# Patient Record
Sex: Male | Born: 1985 | Hispanic: Yes | Marital: Single | Smoking: Never smoker
Health system: Southern US, Community
[De-identification: ages and names within clinical notes are randomized; demographics above are authoritative.]

---

## 2019-09-22 ENCOUNTER — Emergency Department (HOSPITAL_COMMUNITY): Payer: Self-pay

## 2019-09-22 ENCOUNTER — Emergency Department (HOSPITAL_COMMUNITY)
Admission: EM | Admit: 2019-09-22 | Discharge: 2019-09-22 | Disposition: A | Discharge status: Home / Self Care | Payer: Self-pay | Attending: Emergency Medicine | Admitting: Emergency Medicine

## 2019-09-22 ENCOUNTER — Other Ambulatory Visit: Payer: Self-pay

## 2019-09-22 ENCOUNTER — Encounter (HOSPITAL_COMMUNITY): Payer: Self-pay | Admitting: Emergency Medicine

## 2019-09-22 DIAGNOSIS — W293XXA Contact with powered garden and outdoor hand tools and machinery, initial encounter: Secondary | ICD-10-CM | POA: Insufficient documentation

## 2019-09-22 DIAGNOSIS — Z23 Encounter for immunization: Secondary | ICD-10-CM | POA: Insufficient documentation

## 2019-09-22 DIAGNOSIS — Y929 Unspecified place or not applicable: Secondary | ICD-10-CM | POA: Insufficient documentation

## 2019-09-22 DIAGNOSIS — S51811A Laceration without foreign body of right forearm, initial encounter: Secondary | ICD-10-CM

## 2019-09-22 DIAGNOSIS — Y999 Unspecified external cause status: Secondary | ICD-10-CM | POA: Insufficient documentation

## 2019-09-22 DIAGNOSIS — S21311A Laceration without foreign body of right front wall of thorax with penetration into thoracic cavity, initial encounter: Secondary | ICD-10-CM | POA: Insufficient documentation

## 2019-09-22 DIAGNOSIS — S21111A Laceration without foreign body of right front wall of thorax without penetration into thoracic cavity, initial encounter: Secondary | ICD-10-CM

## 2019-09-22 DIAGNOSIS — Y939 Activity, unspecified: Secondary | ICD-10-CM | POA: Insufficient documentation

## 2019-09-22 MED ORDER — LIDOCAINE-EPINEPHRINE-TETRACAINE (LET) TOPICAL GEL
3.0000 mL | Freq: Once | TOPICAL | Status: AC
  Administered 2019-09-22: 3 mL via TOPICAL
  Filled 2019-09-22: qty 3

## 2019-09-22 MED ORDER — LIDOCAINE HCL (PF) 1 % IJ SOLN
30.0000 mL | Freq: Once | INTRAMUSCULAR | Status: DC
  Filled 2019-09-22: qty 30

## 2019-09-22 MED ORDER — TETANUS-DIPHTH-ACELL PERTUSSIS 5-2.5-18.5 LF-MCG/0.5 IM SUSP
0.5000 mL | Freq: Once | INTRAMUSCULAR | Status: AC
  Administered 2019-09-22: 0.5 mL via INTRAMUSCULAR
  Filled 2019-09-22: qty 0.5

## 2019-09-22 MED ORDER — NAPROXEN 250 MG PO TABS
500.0000 mg | ORAL_TABLET | Freq: Once | ORAL | Status: AC
  Administered 2019-09-22: 500 mg via ORAL
  Filled 2019-09-22: qty 2

## 2019-09-22 NOTE — Discharge Instructions (Addendum)
You were seen in the emergency department today after injuries related to a saw.  Your right forearm lacerations were closed with stitches, please keep these clean and dry for the next 24 hours, after 24 hours you may get them wet but do not soak them.  The right chest wall lacerations were closed with Steri-Strips, please leave it in place until they fall off.   We have updated your tetanus today.  Please follow attached wound care instructions.  Your stitches will need to be removed in 1 week, return to ER, go to urgent care, or see your primary care provider.  Please return to the ER sooner for new or worsening symptoms including but not limited to redness around the wound, drainage from the wound, fever, chills, increased pain, or any other concerns.

## 2019-09-22 NOTE — ED Notes (Signed)
Pt transported to xray 

## 2019-09-22 NOTE — ED Triage Notes (Addendum)
Pt reports laceration to R forearm from a saw while working on his house.  Pt had t-shirt tourniquet tied around R arm- removed on arrival.  Approx 5 and 6 cm lacerations to R forearm.  Bleeding controlled  Pt also has multiple superficial lacerations to R chest with bleeding controlled.  Bandages placed on arrival.

## 2019-09-22 NOTE — ED Provider Notes (Addendum)
[facility]   CSN: [id number] Arrival date & time: 09/22/19  1300     History Chief Complaint  Patient presents with  . Extremity Laceration    Adam Frazier is a 34 y.o. male without significant past medical history who presents to the emergency department with injuries to his right chest and right forearm after cutting himself with a chainsaw just prior to arrival.  Patient states that he was utilizing the saw when it kicked back and he accidentally cut himself in the right chest as well as the right forearm, the area was explored, especially the arm, no alleviating or aggravating factors.  No intervention prior to arrival.  Also nicked the 1st/2nd digits of the L hand. Unknown last tetanus.  He denies numbness, tingling, weakness, or any other areas of injury.  Patient primary language is Spanish, however he is able to communicate [place], offered translator which he declined.  HPI     History reviewed. No pertinent past medical history.  There are no problems to display for this patient.   History reviewed. No pertinent surgical history.     No family history on file.  Social History   Tobacco Use  . Smoking status: Never Smoker  . Smokeless tobacco: Never Used  Substance Use Topics  . Alcohol use: Not Currently  . Drug use: Never    Home Medications Prior to Admission medications   Not on File    Allergies    Patient has no allergy information on record.  Review of Systems   Review of Systems  Constitutional: Negative for chills and fever.  Respiratory: Negative for cough and shortness of breath.   Gastrointestinal: Negative for abdominal pain.  Musculoskeletal: Positive for myalgias.  Skin: Positive for wound.  Neurological: Negative for weakness and numbness.  All other systems reviewed and are negative.   Physical Exam Updated Vital Signs BP 133/85 (BP Location: Right Arm)   Pulse 71    Temp 98.7 F (37.1 C) (Oral)   Resp 20   SpO2 99%   Physical Exam Vitals and nursing note reviewed.  Constitutional:      General: He is not in acute distress.    Appearance: Normal appearance. He is not ill-appearing or toxic-appearing.  HENT:     Head: Normocephalic and atraumatic.  Neck:     Comments: No midline tenderness.  Cardiovascular:     Rate and Rhythm: Normal rate.     Pulses:          Radial pulses are 2+ on the right side and 2+ on the left side.  Pulmonary:     Effort: No respiratory distress.     Breath sounds: Normal breath sounds.  Chest:     Comments: Right chest wall just superior to the areola there are several abrasions/lacerations.  Lacerations are approximately 2 to 3 mm deep with obvious subcutaneous tissue exposed.  There is no visible foreign bodies. Abdominal:     General: There is no distension.     Palpations: Abdomen is soft.     Tenderness: There is no abdominal tenderness. There is no guarding or rebound.  Musculoskeletal:     Cervical back: Normal range of motion and neck supple.     Comments: Upper extremities: Patient has irregularly shaped lacerations to the ventral aspect of the right proximal forearm as pictured below.  Horizontal wound measures 7 cm in diameter area measures 7 cm, which would have subcutaneous fat  exposed, no visible tendon or fascia, approximately 3- 4 mm deep each. no obvious visible foreign bodies.  Patient has a small abrasion to the palmar aspect of the L thumb as well as a skin avulsion to the distal L 2nd digit with a piece of nail avulsed, does not extend back into the nailbed proximal to nail avulsion.  Patient has intact AROM throughout. . Tender to palpation over the wounds.  Has a few superficial abrasions more proximally to the upper arm right upper extremity.  Skin:    General: Skin is warm and dry.     Capillary Refill: Capillary refill takes less than 2 seconds.  Neurological:     Mental Status: He is alert.      Comments: Alert. Clear speech. Sensation grossly intact to bilateral upper extremities. 5/5 symmetric grip strength. Ambulatory.  Able to perform okay sign, thumbs up, cross second/third digits bilaterally.  Psychiatric:        Mood and Affect: Mood normal.        Behavior: Behavior normal.             ED Results / Procedures / Treatments   Labs (all labs ordered are listed, but only abnormal results are displayed) Labs Reviewed - No data to display  EKG None  Radiology No results found.  Procedures .Marland KitchenLaceration Repair  Date/Time: 09/22/2019 3:35 PM Performed by: Cherly Anderson, PA-C Authorized by: Cherly Anderson, PA-C   Consent:    Consent obtained:  Verbal   Consent given by:  Patient   Risks discussed:  Infection, need for additional repair, nerve damage, poor wound healing, poor cosmetic result, pain, retained foreign body, vascular damage and tendon damage   Alternatives discussed:  No treatment Anesthesia (see MAR for exact dosages):    Anesthesia method:  Topical application and local infiltration   Topical anesthetic:  LET   Local anesthetic:  Lidocaine 1% w/o epi Laceration details:    Location:  Shoulder/arm (More proximal laceration)   Shoulder/arm location:  R lower arm   Length (cm):  7   Depth (mm):  3 Repair type:    Repair type:  Simple Pre-procedure details:    Preparation:  Patient was prepped and draped in usual sterile fashion and imaging obtained to evaluate for foreign bodies Exploration:    Hemostasis achieved with:  Direct pressure   Contaminated: no   Treatment:    Area cleansed with:  Betadine   Amount of cleaning:  Standard   Irrigation solution:  Sterile water   Irrigation method:  Pressure wash Skin repair:    Repair method:  Sutures   Suture size:  4-0   Suture material:  Nylon   Suture technique:  Simple interrupted   Number of sutures:  7 Approximation:    Approximation:  Close Post-procedure  details:    Patient tolerance of procedure:  Tolerated well, no immediate complications .Marland KitchenLaceration Repair  Date/Time: 09/22/2019 3:38 PM Performed by: Cherly Anderson, PA-C Authorized by: Cherly Anderson, PA-C   Consent:    Consent obtained:  Verbal   Consent given by:  Patient   Risks discussed:  Infection, need for additional repair, nerve damage, poor wound healing, poor cosmetic result, pain, retained foreign body, tendon damage and vascular damage   Alternatives discussed:  No treatment Anesthesia (see MAR for exact dosages):    Anesthesia method:  Local infiltration and topical application   Topical anesthetic:  LET   Local anesthetic:  Lidocaine 1% w/o  epi Laceration details:    Location:  Shoulder/arm   Shoulder/arm location:  R lower arm   Length (cm):  7 Repair type:    Repair type:  Simple Pre-procedure details:    Preparation:  Patient was prepped and draped in usual sterile fashion and imaging obtained to evaluate for foreign bodies Exploration:    Hemostasis achieved with:  Direct pressure   Wound exploration: wound explored through full range of motion and entire depth of wound probed and visualized     Contaminated: no   Treatment:    Area cleansed with:  Betadine   Amount of cleaning:  Standard   Irrigation solution:  Sterile water   Irrigation method:  Pressure wash Skin repair:    Repair method:  Sutures   Suture size:  4-0   Suture material:  Nylon   Suture technique:  Simple interrupted   Number of sutures:  9 Approximation:    Approximation:  Close Post-procedure details:    Patient tolerance of procedure:  Tolerated well, no immediate complications .Marland KitchenLaceration Repair  Date/Time: 09/22/2019 3:39 PM Performed by: Cherly Anderson, PA-C Authorized by: Cherly Anderson, PA-C   Consent:    Consent obtained:  Verbal   Consent given by:  Patient   Risks discussed:  Infection, need for additional repair, poor cosmetic  result, pain, retained foreign body and poor wound healing   Alternatives discussed:  No treatment Anesthesia (see MAR for exact dosages):    Anesthesia method:  None Laceration details:    Location:  Trunk   Trunk location:  R chest   Wound length (cm): multiple small linear lacerations. Repair type:    Repair type:  Simple Pre-procedure details:    Preparation:  Imaging obtained to evaluate for foreign bodies Exploration:    Hemostasis achieved with:  Direct pressure   Wound exploration: wound explored through full range of motion and entire depth of wound probed and visualized   Treatment:    Area cleansed with:  Betadine   Irrigation solution:  Sterile water   Visualized foreign bodies/material removed: no   Skin repair:    Repair method:  Steri-Strips and tissue adhesive   Number of Steri-Strips:  4 (steri strips applied, dermabond utilized to secure) Approximation:    Approximation:  Close Post-procedure details:    Patient tolerance of procedure:  Tolerated well, no immediate complications   (including critical care time)  Medications Ordered in ED Medications  lidocaine (PF) (XYLOCAINE) 1 % injection 30 mL (has no administration in time range)  lidocaine-EPINEPHrine-tetracaine (LET) topical gel (3 mLs Topical Given 09/22/19 1349)  naproxen (NAPROSYN) tablet 500 mg (500 mg Oral Given 09/22/19 1349)  Tdap (BOOSTRIX) injection 0.5 mL (0.5 mLs Intramuscular Given 09/22/19 1350)    ED Course  I have reviewed the triage vital signs and the nursing notes.  Pertinent labs & imaging results that were available during my care of the patient were reviewed by me and considered in my medical decision making (see chart for details).    MDM Rules/Calculators/A&P                          Patient presents to the emergency department status post injuries related to a saw.  Patient is nontoxic, resting comfortably, vitals within normal limits.  Imaging obtained and areas of injury, no  underlying fracture/dislocation or radiopaque foreign bodies.  All soft tissue injuries were cleansed with Betadine and irrigated with sterile water,  visualized in a bloodless field without evidence of foreign body.  Right upper extremity x-rays were closed with sutures per procedure note above, chest wall wounds were closed with Steri-Strips and secured with Dermabond per procedure note above.  He has a left second digit skin avulsion did avulsed part of the nail which was clipped, does not seem to extend back into the nailbed, does not appear amenable to closure with sutures, does not appear to require nail removal at this time.  Bacitracin and dressings applied.  Patient neurovascularly intact distally to all wounds.  Will discharge home with wound care instructions and need for return for suture removal in the right upper extremity. I discussed results, treatment plan, need for follow-up, and return precautions with the patient. Provided opportunity for questions, patient confirmed understanding and is in agreement with plan. Findings & plan of care discussed w/ Dr. Lynelle Doctor who is in agreement.   Final Clinical Impression(s) / ED Diagnoses Final diagnoses:  Laceration of right forearm, initial encounter  Laceration of chest wall, right, initial encounter    Rx / DC Orders ED Discharge Orders    None       Cherly Anderson, PA-C 09/22/19 [address], PA-C 09/22/19 1638    Linwood Dibbles, MD 09/23/19 1525

## 2019-09-22 NOTE — ED Notes (Signed)
Pt transported back to xray

## 2019-09-30 ENCOUNTER — Other Ambulatory Visit: Payer: Self-pay

## 2019-09-30 ENCOUNTER — Ambulatory Visit (HOSPITAL_COMMUNITY)
Admission: EM | Admit: 2019-09-30 | Discharge: 2019-09-30 | Disposition: A | Discharge status: Home / Self Care | Payer: Self-pay

## 2019-09-30 NOTE — ED Triage Notes (Signed)
Pt presents to have sutures removed from right arm placed 9 days ago in the ed.

## 2021-06-05 ENCOUNTER — Emergency Department (HOSPITAL_COMMUNITY)
Admission: EM | Admit: 2021-06-05 | Discharge: 2021-06-05 | Disposition: A | Discharge status: Home / Self Care | Payer: Self-pay | Attending: Emergency Medicine | Admitting: Emergency Medicine

## 2021-06-05 ENCOUNTER — Other Ambulatory Visit: Payer: Self-pay

## 2021-06-05 ENCOUNTER — Encounter (HOSPITAL_COMMUNITY): Payer: Self-pay | Admitting: *Deleted

## 2021-06-05 DIAGNOSIS — R21 Rash and other nonspecific skin eruption: Secondary | ICD-10-CM | POA: Insufficient documentation

## 2021-06-05 DIAGNOSIS — M545 Low back pain, unspecified: Secondary | ICD-10-CM | POA: Insufficient documentation

## 2021-06-05 LAB — CBC
HCT: 40.3 % (ref 39.0–52.0)
Hemoglobin: 14.3 g/dL (ref 13.0–17.0)
MCH: 32.1 pg (ref 26.0–34.0)
MCHC: 35.5 g/dL (ref 30.0–36.0)
MCV: 90.4 fL (ref 80.0–100.0)
Platelets: 196 10*3/uL (ref 150–400)
RBC: 4.46 MIL/uL (ref 4.22–5.81)
RDW: 11.9 % (ref 11.5–15.5)
WBC: 5.4 10*3/uL (ref 4.0–10.5)
nRBC: 0 % (ref 0.0–0.2)

## 2021-06-05 LAB — COMPREHENSIVE METABOLIC PANEL
ALT: 20 U/L (ref 0–44)
AST: 25 U/L (ref 15–41)
Albumin: 3.9 g/dL (ref 3.5–5.0)
Alkaline Phosphatase: 81 U/L (ref 38–126)
Anion gap: 8 (ref 5–15)
BUN: 21 mg/dL — ABNORMAL HIGH (ref 6–20)
CO2: 22 mmol/L (ref 22–32)
Calcium: 8.9 mg/dL (ref 8.9–10.3)
Chloride: 107 mmol/L (ref 98–111)
Creatinine, Ser: 1.01 mg/dL (ref 0.61–1.24)
GFR, Estimated: >60 mL/min (ref >60)
Glucose, Bld: 135 mg/dL — ABNORMAL HIGH (ref 70–99)
Potassium: 3.7 mmol/L (ref 3.5–5.1)
Sodium: 137 mmol/L (ref 135–145)
Total Bilirubin: 0.6 mg/dL (ref 0.3–1.2)
Total Protein: 6.2 g/dL — ABNORMAL LOW (ref 6.5–8.1)

## 2021-06-05 LAB — LIPASE, BLOOD: Lipase: 36 U/L (ref 11–51)

## 2021-06-05 LAB — URINALYSIS, ROUTINE W REFLEX MICROSCOPIC
Bilirubin Urine: NEGATIVE
Glucose, UA: NEGATIVE mg/dL
Hgb urine dipstick: NEGATIVE
Ketones, ur: NEGATIVE mg/dL
Leukocytes,Ua: NEGATIVE
Nitrite: NEGATIVE
Protein, ur: NEGATIVE mg/dL
Specific Gravity, Urine: 1.019 (ref 1.005–1.030)
pH: 5 (ref 5.0–8.0)

## 2021-06-05 MED ORDER — FAMOTIDINE 20 MG PO TABS
40.0000 mg | ORAL_TABLET | Freq: Once | ORAL | Status: AC
  Administered 2021-06-05: 40 mg via ORAL
  Filled 2021-06-05: qty 2

## 2021-06-05 MED ORDER — FAMOTIDINE 20 MG PO TABS: 20.0000 mg | ORAL_TABLET | Freq: Two times a day (BID) | ORAL | 0 refills | Status: AC

## 2021-06-05 MED ORDER — PREDNISONE 10 MG PO TABS: 30.0000 mg | ORAL_TABLET | Freq: Every day | ORAL | 0 refills | Status: AC

## 2021-06-05 MED ORDER — DIPHENHYDRAMINE HCL 25 MG PO TABS: 25.0000 mg | ORAL_TABLET | Freq: Four times a day (QID) | ORAL | 0 refills | Status: AC | PRN

## 2021-06-05 MED ORDER — DICLOFENAC SODIUM 1 % EX GEL: 4.0000 g | Freq: Four times a day (QID) | CUTANEOUS | 0 refills | Status: AC

## 2021-06-05 MED ORDER — TRIAMCINOLONE ACETONIDE 0.1 % EX CREA: 1.0000 "application " | TOPICAL_CREAM | Freq: Two times a day (BID) | CUTANEOUS | 0 refills | Status: AC

## 2021-06-05 NOTE — Discharge Instructions (Addendum)
I suspect your rash is due to exposure to some irritating agent.  ?If you have recently changed detergents frequently or close please change back or use a hypoallergenic option. ? ?Keep the area moist with medications such as Aquaphor or Eucerin or Vaseline. ? ?Take Benadryl and Pepcid as prescribed.  I have also prescribed you some hydrocortisone cream ? ?In terms of the pain you are having in your left lower back I recommend warm compresses, Tylenol and ibuprofen as discussed below, massage, gentle stretching, Bengay/IcyHot or similar product.  Voltaren gel can also be helpful as an over-the-counter medicine. ? ?Please follow-up with the dermatologist for your rash and your primary care doctor for continued preventative care and to reevaluate your back pain and make sure it is improving. ? ?Please use Tylenol or ibuprofen for pain.  You may use 600 mg ibuprofen every 6 hours or 1000 mg of Tylenol every 6 hours.  You may choose to alternate between the 2.  This would be most effective.  Not to exceed 4 g of Tylenol within 24 hours.  Not to exceed 3200 mg ibuprofen 24 hours.  ? ?Return to the ER for any new or concerning symptoms ? ? ? ?Sospecho que su sarpullido se debe a la exposici?n a alg?n agente irritante. ?Si recientemente ha [employer] con frecuencia o ha cerrado, [place] o use una opci?n hipoalerg?nica. ? ?Mantenga el ?rea h?meda con medicamentos como Aquaphor o Eucerin o Vaseline. ? ?Tome Benadryl y Pepcid seg?n lo prescrito. Tambi?n te he recetado una crema de hidrocortisona. ? ?En cuanto al dolor que tiene en la parte baja de la espalda izquierda, le recomiendo compresas tibias, Tylenol e ibuprofeno como se explica a continuaci?n, masajes, estiramientos suaves, Bengay/IcyHot o un producto similar. Voltaren gel tambi?n puede ser ?til [place]. ? ?Haga un seguimiento con el dermat?logo para su erupci?n y con su m?dico de atenci?n primaria para recibir atenci?n  preventiva continua y para reevaluar su dolor de espalda y asegurarse de que est? mejorando. ? ?Utilice Tylenol o ibuprofeno para el dolor. Puede usar 600 mg de ibuprofeno cada 6 horas o 1000 mg de Tylenol cada 6 horas. Puede optar por [employer] 2. Esto ser?a m?s eficaz. No exceder los 4 g de Tylenol en 24 horas. No exceder los 3200 mg de ibuprofeno 24 horas. ? ?Regrese a la sala de emergencias por cualquier s?ntoma nuevo o preocupante ?

## 2021-06-05 NOTE — ED Triage Notes (Signed)
The pt has an itching raised red rash scattered over his arms and his torso  language barrier ?

## 2021-06-05 NOTE — ED Provider Notes (Signed)
?[facility] ?Provider Note ? ? ?CSN: [id number] ?Arrival date & time: 06/05/21  0057 ? ?  ? ?History ? ?Chief Complaint  ?Patient presents with  ? Back Pain  ? ? ?Adam Frazier is a 36 y.o. male. ? ? ?Back Pain ?Spanish-language interpreter was used for the entirety of visit.  Patient is a 36 year old Spanish-speaking male presented to the emergency room today with complaints of rash and left buttocks pain.  He states that he is generally healthy.  He does have a primary care provider that he states that he is scheduled to see next month. ? ?He states that over the past 4 days he has had a rash that was itchy seems to be located primarily on his right forearm and inside his pants waistline on the left and right side of his hip but not circulating all the way around.  He states that his itchy uncomfortable but not necessarily painful.  He denies any known exposure to any new irritants such as new animals and denies any new medications.  He does not think that he has any new detergents in the house but will have to verify this with his wife.  He denies any nausea vomiting shortness of breath lip or tongue or throat swelling.   ? ?He states that he is also here because he is having some back pain.  Seems that it is left buttocks in origin.  He denies any new straining activities or heavy exercise.  He works a sedentary job.  He does not have any pain radiating down his leg but rather seems to be in his left buttocks although he tells me that at one point over the past 2 weeks it has been worse and extended across his low back ? ?  ? ?Home Medications ?Prior to Admission medications   ?Medication Sig Start Date End Date Taking? Authorizing Provider  ?acetaminophen (TYLENOL) 500 MG tablet Take 500 mg by mouth every 6 (six) hours as needed for moderate pain or headache.   Yes [provider]  ?diclofenac Sodium (VOLTAREN) 1 % GEL Apply 4 g topically 4 (four) times  daily. 06/05/21  Yes Gailen ShelterFondaw, Romel Dumond S, PA  ?diphenhydrAMINE (BENADRYL) 25 MG tablet Take 1 tablet (25 mg total) by mouth every 6 (six) hours as needed. 06/05/21  Yes Solon AugustaFondaw, Josephmichael Lisenbee S, PA  ?famotidine (PEPCID) 20 MG tablet Take 1 tablet (20 mg total) by mouth 2 (two) times daily. 06/05/21  Yes [place] Sigmund, Rodrigo RanWylder S, PA  ?loratadine (CLARITIN) 10 MG tablet Take 10 mg by mouth daily as needed for allergies.   Yes [provider]  ?Multiple Minerals-Vitamins (CAL-MAG-ZINC-D PO) Take 3 tablets by mouth daily.   Yes [provider]  ?OVER THE COUNTER MEDICATION Take 1 tablet by mouth every 8 (eight) hours as needed (leg and back pain). Leg and Back Pain Reliever   Yes [provider]  ?triamcinolone cream (KENALOG) 0.1 % Apply 1 application. topically 2 (two) times daily. Apply to the areas of rash 06/05/21  Yes Gailen ShelterFondaw, Williamson Cavanah S, PA  ?   ? ?Allergies    ?Patient has no known allergies.   ? ?Review of Systems   ?Review of Systems  ?Musculoskeletal:  Positive for back pain.  ? ?Physical Exam ?Updated Vital Signs ?BP (!) 119/91   Pulse 68   Temp 97.7 ?F (36.5 ?C)   Resp 19   Ht 5\' 5"  (1.651 m)   Wt 77.1 kg   SpO2  100%   BMI 28.29 kg/m?  ?Physical Exam ?Vitals and nursing note reviewed.  ?Constitutional:   ?   General: He is not in acute distress. ?HENT:  ?   Head: Normocephalic and atraumatic.  ?   Nose: Nose normal.  ?   Mouth/Throat:  ?   Mouth: Mucous membranes are moist.  ?Eyes:  ?   General: No scleral icterus. ?Cardiovascular:  ?   Rate and Rhythm: Normal rate and regular rhythm.  ?   Pulses: Normal pulses.  ?   Heart sounds: Normal heart sounds.  ?Pulmonary:  ?   Effort: Pulmonary effort is normal.  ?Abdominal:  ?   Palpations: Abdomen is soft.  ?   Tenderness: There is no abdominal tenderness. There is no guarding or rebound.  ?Musculoskeletal:  ?   Cervical back: Normal range of motion.  ?   Right lower leg: No edema.  ?   Left lower leg: No edema.  ?Skin: ?   General: Skin is warm and dry.   ?   Capillary Refill: Capillary refill takes less than 2 seconds.  ?   Comments: Rash to left forearm and BL anterior hips see pictures below.  ? ?Blanching  ?Neurological:  ?   Mental Status: He is alert. Mental status is at baseline.  ?Psychiatric:     ?   Mood and Affect: Mood normal.     ?   Behavior: Behavior normal.  ? ? ? ? ? ? ? ?ED Results / Procedures / Treatments   ?Labs ?(all labs ordered are listed, but only abnormal results are displayed) ?Labs Reviewed  ?COMPREHENSIVE METABOLIC PANEL - Abnormal; Notable for the following components:  ?    Result Value  ? Glucose, Bld 135 (*)   ? BUN 21 (*)   ? Total Protein 6.2 (*)   ? All other components within normal limits  ?LIPASE, BLOOD  ?CBC  ?URINALYSIS, ROUTINE W REFLEX MICROSCOPIC  ? ? ?EKG ?None ? ?Radiology ?No results found. ? ?Procedures ?Procedures  ? ? ?Medications Ordered in ED ?Medications  ?famotidine (PEPCID) tablet 40 mg (has no administration in time range)  ? ? ?ED Course/ Medical Decision Making/ A&P ?  ?                        ?Medical Decision Making ? ?This patient presents to the ED for concern of rash, back pain, this involves a number of treatment options, and is a complaint that carries with it a high risk of complications and morbidity.  The differential diagnosis includes The emergent differential diagnosis for back pain includes but is not limited to fracture, muscle strain, cauda equina, spinal stenosis. DDD, ankylosing spondylitis, acute ligamentous injury, disk herniation, spondylolisthesis, Epidural compression syndrome, metastatic cancer, transverse myelitis, vertebral osteomyelitis, diskitis, kidney stone, pyelonephritis, AAA, Perforated ulcer, Retrocecal appendicitis, pancreatitis, bowel obstruction, retroperitoneal hemorrhage or mass, meningitis.  ? ?Patient has no midline back pain.  Low suspicion for spinal epidural abscess or other disease of the spinal column suspect this is more muscular given the location and his exam  and history. ? ? ?Co morbidities: ?Discussed in HPI ? ? ?Brief History: ? ?Spanish-language interpreter was used for the entirety of visit.  Patient is a 36 year old Spanish-speaking male presented to the emergency room today with complaints of rash and left buttocks pain.  He states that he is generally healthy.  He does have a primary care provider that he states that he  is scheduled to see next month. ? ?He states that over the past 4 days he has had a rash that was itchy seems to be located primarily on his right forearm and inside his pants waistline on the left and right side of his hip but not circulating all the way around.  He states that his itchy uncomfortable but not necessarily painful.  He denies any known exposure to any new irritants such as new animals and denies any new medications.  He does not think that he has any new detergents in the house but will have to verify this with his wife.  He denies any nausea vomiting shortness of breath lip or tongue or throat swelling.   ? ?He states that he is also here because he is having some back pain.  Seems that it is left buttocks in origin.  He denies any new straining activities or heavy exercise.  He works a sedentary job.  He does not have any pain radiating down his leg but rather seems to be in his left buttocks although he tells me that at one point over the past 2 weeks it has been worse and extended across his low back ? ? ?PE: rash present, TTP over gluteal musculature  ? ? ? ?EMR reviewed including pt PMHx, past surgical history and past visits to ER.  ? ?See HPI for more details ? ? ?Lab Tests: ? ?Labs and urinalysis were ordered in triage ?I personally interpreted his labs.  CMP relatively unremarkable, no anion gap normal bicarb and electrolytes are normal.  CBC lipase and urinalysis unremarkable. ? ? ?Imaging Studies: ? ?No imaging studies ordered for this patient ? ? ? ?Cardiac Monitoring: ? ? ? ? ? ?Medicines ordered: ? ?I ordered  medication including famotidine for itching rash ?Reevaluation of the patient after these medicines showed that the patient stayed the same patient was found in the department on after to reevaluate after medication. ?

## 2021-06-05 NOTE — ED Triage Notes (Signed)
The pt is c/o lt flank pain and some redness of this rt forearm  in a straight line  no known injury  the pt reports back pain 2 weeks ago that went away then returned today ?

## 2021-06-13 ENCOUNTER — Other Ambulatory Visit: Payer: Self-pay

## 2021-06-13 ENCOUNTER — Emergency Department (HOSPITAL_COMMUNITY)
Admission: EM | Admit: 2021-06-13 | Discharge: 2021-06-13 | Disposition: A | Discharge status: Home / Self Care | Payer: Self-pay | Attending: Student | Admitting: Student

## 2021-06-13 DIAGNOSIS — R21 Rash and other nonspecific skin eruption: Secondary | ICD-10-CM

## 2021-06-13 DIAGNOSIS — L299 Pruritus, unspecified: Secondary | ICD-10-CM | POA: Insufficient documentation

## 2021-06-13 MED ORDER — METHYLPREDNISOLONE 4 MG PO TBPK: ORAL_TABLET | ORAL | 0 refills | Status: AC

## 2021-06-13 MED ORDER — HYDROXYZINE HCL 25 MG PO TABS
25.0000 mg | ORAL_TABLET | Freq: Once | ORAL | Status: AC
  Administered 2021-06-13: 25 mg via ORAL
  Filled 2021-06-13: qty 1

## 2021-06-13 MED ORDER — PERMETHRIN 5 % EX CREA: TOPICAL_CREAM | CUTANEOUS | 0 refills | Status: AC

## 2021-06-13 MED ORDER — PREDNISONE 20 MG PO TABS
60.0000 mg | ORAL_TABLET | Freq: Once | ORAL | Status: AC
  Administered 2021-06-13: 60 mg via ORAL
  Filled 2021-06-13: qty 3

## 2021-06-13 MED ORDER — HYDROXYZINE HCL 25 MG PO TABS: 25.0000 mg | ORAL_TABLET | Freq: Four times a day (QID) | ORAL | 0 refills | Status: AC

## 2021-06-13 NOTE — ED Provider Triage Note (Signed)
Emergency Medicine Provider Triage Evaluation Note  Adam Frazier , a 36 y.o. male  was evaluated in triage.  Pt complains of rash for the past 14 days. He was seen and evaluated on 06/05/2021 and was given topical steroids and antihistamines.  He has been using these medications but feels like his symptoms have worsened.  Feels that his rash has spread to his torso and is more itchy on his arms.  The only thing that he can think of would cause of symptoms is that he reached under his trailer to find some nails when symptoms first began.  Denies any lip or tongue swelling.  Review of Systems  Positive: Rash Negative: Angioedema  Physical Exam  There were no vitals taken for this visit. Gen:   Awake, no distress   Resp:  Normal effort  MSK:   Moves extremities without difficulty  Other:  Rash noted to torso and right forearm, appears progressed from prior pictures on 06/05/2021  Medical Decision Making  Medically screening exam initiated at 5:23 PM.  Appropriate orders placed.  Adam Frazier was informed that the remainder of the evaluation will be completed by another provider, this initial triage assessment does not replace that evaluation, and the importance of remaining in the ED until their evaluation is complete.  We will need further treatment   Dietrich Pates, PA-C 06/13/21 1724

## 2021-06-13 NOTE — ED Triage Notes (Signed)
Patient coming from home, complaint of rash on arms, was seen on 5/14 for same, given medication but has not had relief.

## 2021-06-13 NOTE — ED Provider Notes (Signed)
[facility] EMERGENCY DEPARTMENT Provider Note  CSN: [id number] Arrival date & time: 06/13/21 1628  Chief Complaint(s) Rash  HPI Adam Frazier is a 36 y.o. male who presents emergency department for evaluation of a pruritic rash.  Patient was seen on 06/05/2021 for similar complaint.  He states that it all began when he stuck his right hand into a box in the garage and he then had pruritic papular rash that spread up his right arm and onto the chest.  There are no lesions on the back, perineum.  There are also lesions on the calves.  He denies associated chest pain, shortness of breath, Donnell pain, nausea, vomiting or other systemic symptoms.  He states that he has been using his triamcinolone cream, taking Benadryl and Pepcid without relief.    Past Medical History No past medical history on file. There are no problems to display for this patient.  Home Medication(s) Prior to Admission medications   Medication Sig Start Date End Date Taking? Authorizing Provider  acetaminophen (TYLENOL) 500 MG tablet Take 500 mg by mouth every 6 (six) hours as needed for moderate pain or headache.    [provider]  diclofenac Sodium (VOLTAREN) 1 % GEL Apply 4 g topically 4 (four) times daily. 06/05/21   Gailen Shelter, PA  diphenhydrAMINE (BENADRYL) 25 MG tablet Take 1 tablet (25 mg total) by mouth every 6 (six) hours as needed. 06/05/21   Gailen Shelter, PA  famotidine (PEPCID) 20 MG tablet Take 1 tablet (20 mg total) by mouth 2 (two) times daily. 06/05/21   Gailen Shelter, PA  loratadine (CLARITIN) 10 MG tablet Take 10 mg by mouth daily as needed for allergies.    [provider]  Multiple Minerals-Vitamins (CAL-MAG-ZINC-D PO) Take 3 tablets by mouth daily.    [provider]  OVER THE COUNTER MEDICATION Take 1 tablet by mouth every 8 (eight) hours as needed (leg and back pain). Leg and Back Pain Reliever    [provider]   triamcinolone cream (KENALOG) 0.1 % Apply 1 application. topically 2 (two) times daily. Apply to the areas of rash 06/05/21   Gailen Shelter, PA                                                                                                                                    Past Surgical History No past surgical history on file. Family History No family history on file.  Social History Social History   Tobacco Use   Smoking status: Never   Smokeless tobacco: Never  Substance Use Topics   Alcohol use: Not Currently   Drug use: Never   Allergies Patient has no known allergies.  Review of Systems Review of Systems  Skin:  Positive for rash.   Physical Exam Vital Signs  I have reviewed the triage vital signs BP (!) 138/93   Pulse 69  Temp (!) 97.5 F (36.4 C) (Oral)   Resp 16   SpO2 98%   Physical Exam Vitals and nursing note reviewed.  Constitutional:      General: He is not in acute distress.    Appearance: He is well-developed.  HENT:     Head: Normocephalic and atraumatic.  Eyes:     Conjunctiva/sclera: Conjunctivae normal.  Cardiovascular:     Rate and Rhythm: Normal rate and regular rhythm.     Heart sounds: No murmur heard. Pulmonary:     Effort: Pulmonary effort is normal. No respiratory distress.     Breath sounds: Normal breath sounds.  Abdominal:     Palpations: Abdomen is soft.     Tenderness: There is no abdominal tenderness.  Musculoskeletal:        General: No swelling.     Cervical back: Neck supple.  Skin:    General: Skin is warm and dry.     Capillary Refill: Capillary refill takes less than 2 seconds.     Findings: Rash present.  Neurological:     Mental Status: He is alert.  Psychiatric:        Mood and Affect: Mood normal.    ED Results and Treatments Labs (all labs ordered are listed, but only abnormal results are displayed) Labs Reviewed - No data to display                                                                                                                         Radiology No results found.  Pertinent labs & imaging results that were available during my care of the patient were reviewed by me and considered in my medical decision making (see MDM for details).  Medications Ordered in ED Medications - No data to display                                                                                                                                   Procedures Procedures  (including critical care time)  Medical Decision Making / ED Course   This patient presents to the ED for concern of rash, this involves an extensive number of treatment options, and is a complaint that carries with it a high risk of complications and morbidity.  The differential diagnosis includes contact dermatitis, eczema, urticaria, scabies, bedbugs  MDM: Patient seen emergency room for evaluation of pruritic rash.  Physical exam  reveals an extensive papular pruritic rash of the right arm, across the trunk with a few lesions on the inner calves.  No mucosal involvement.  As the patient's main complaint is itching, we trialed steroids and Atarax today and the patient has had some mild improvement.  We will discharge the patient on a Medrol Dosepak and Atarax and the patient will need to follow-up outpatient with dermatology for further evaluation.  Given that the patient's pruritus is quite significant, cannot rule out scabies and given favorable side effect profile to single dose permethrin cream, we will also try this.  Patient then discharged with dermatology follow-up.  Low suspicion for SJS or dress given current clinical presentation and no additional associated symptoms.   Additional history obtained:  -External records from outside source obtained and reviewed including: Chart review including previous notes, labs, imaging, consultation notes    Medicines ordered and prescription drug management: No orders of the  defined types were placed in this encounter.   -I have reviewed the patients home medicines and have made adjustments as needed  Critical interventions none    Cardiac Monitoring: The patient was maintained on a cardiac monitor.  I personally viewed and interpreted the cardiac monitored which showed an underlying rhythm of: NSR  Social Determinants of Health:  Factors impacting patients care include: spanish speaking   Reevaluation: After the interventions noted above, I reevaluated the patient and found that they have :improved  Co morbidities that complicate the patient evaluation No past medical history on file.    Dispostion: I considered admission for this patient, but he does not meet inpatient criteria for admission and is safe for discharge with outpatient dermatology follow-up.     Final Clinical Impression(s) / ED Diagnoses Final diagnoses:  None     @PCDICTATION @    , MD 06/13/21 [phone]

## 2021-09-12 IMAGING — DX DG HAND COMPLETE 3+V*L*
3 series · 3 of 3 positions shown · non-contrast
Comparison: None.

CLINICAL DATA: Injury. Index finger laceration secondary to saw
accident.

EXAM:
LEFT HAND - COMPLETE 3+ VIEW

[x hand pa left]
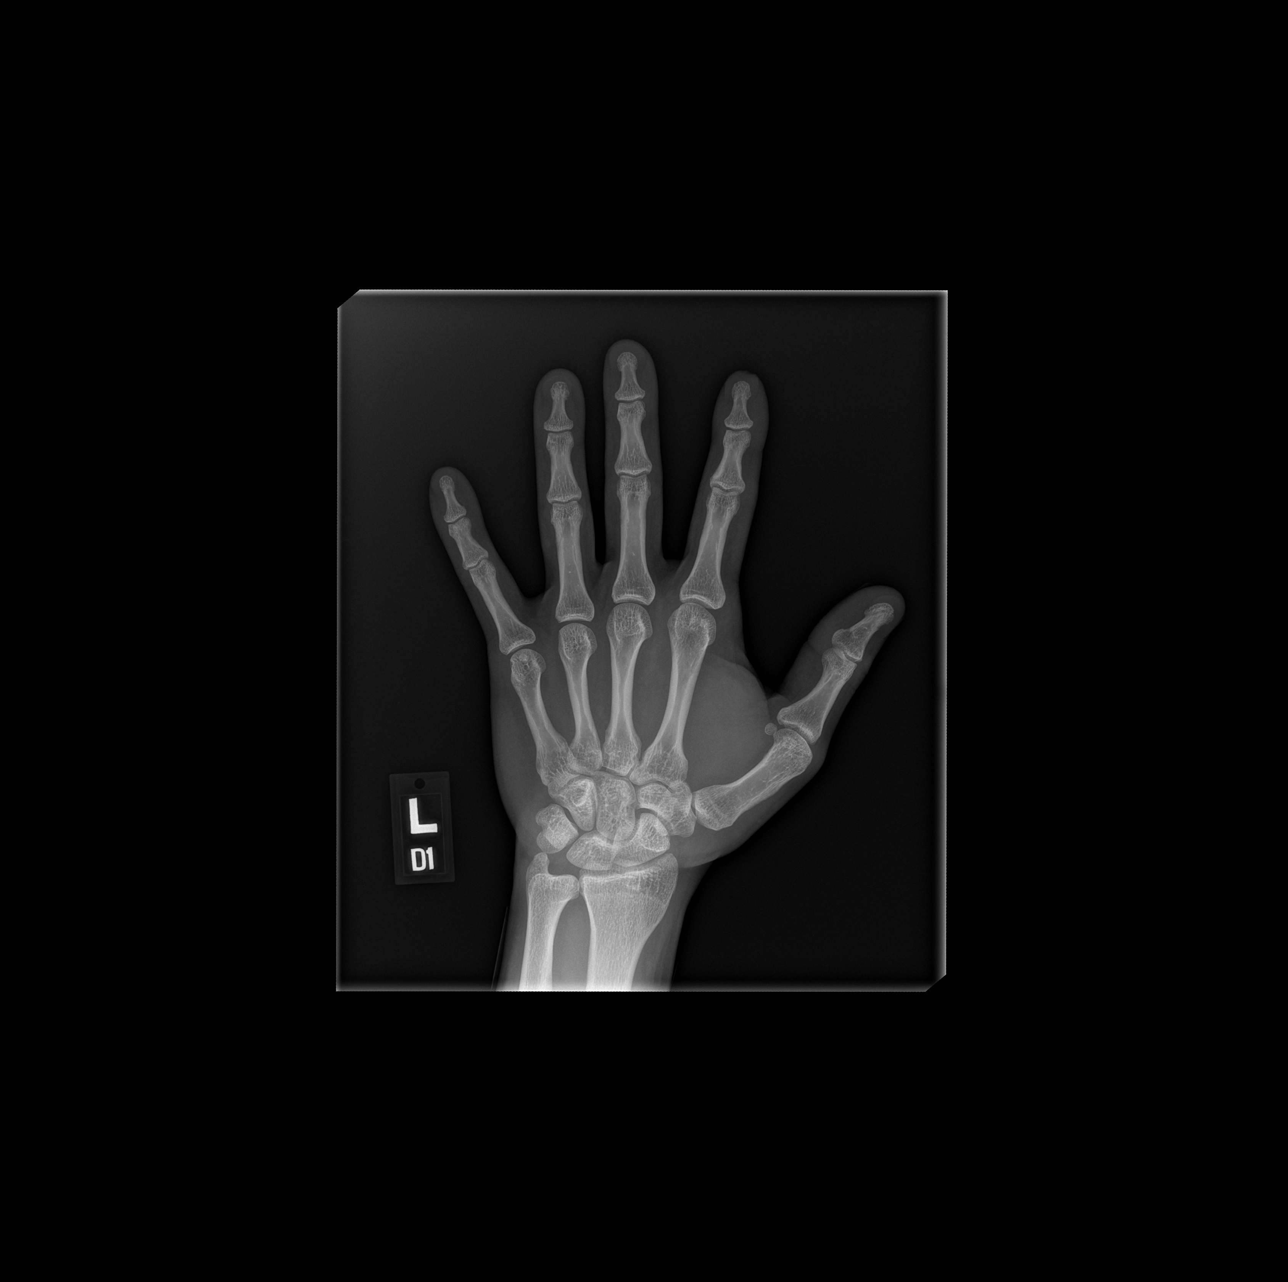

[x hand obl left]
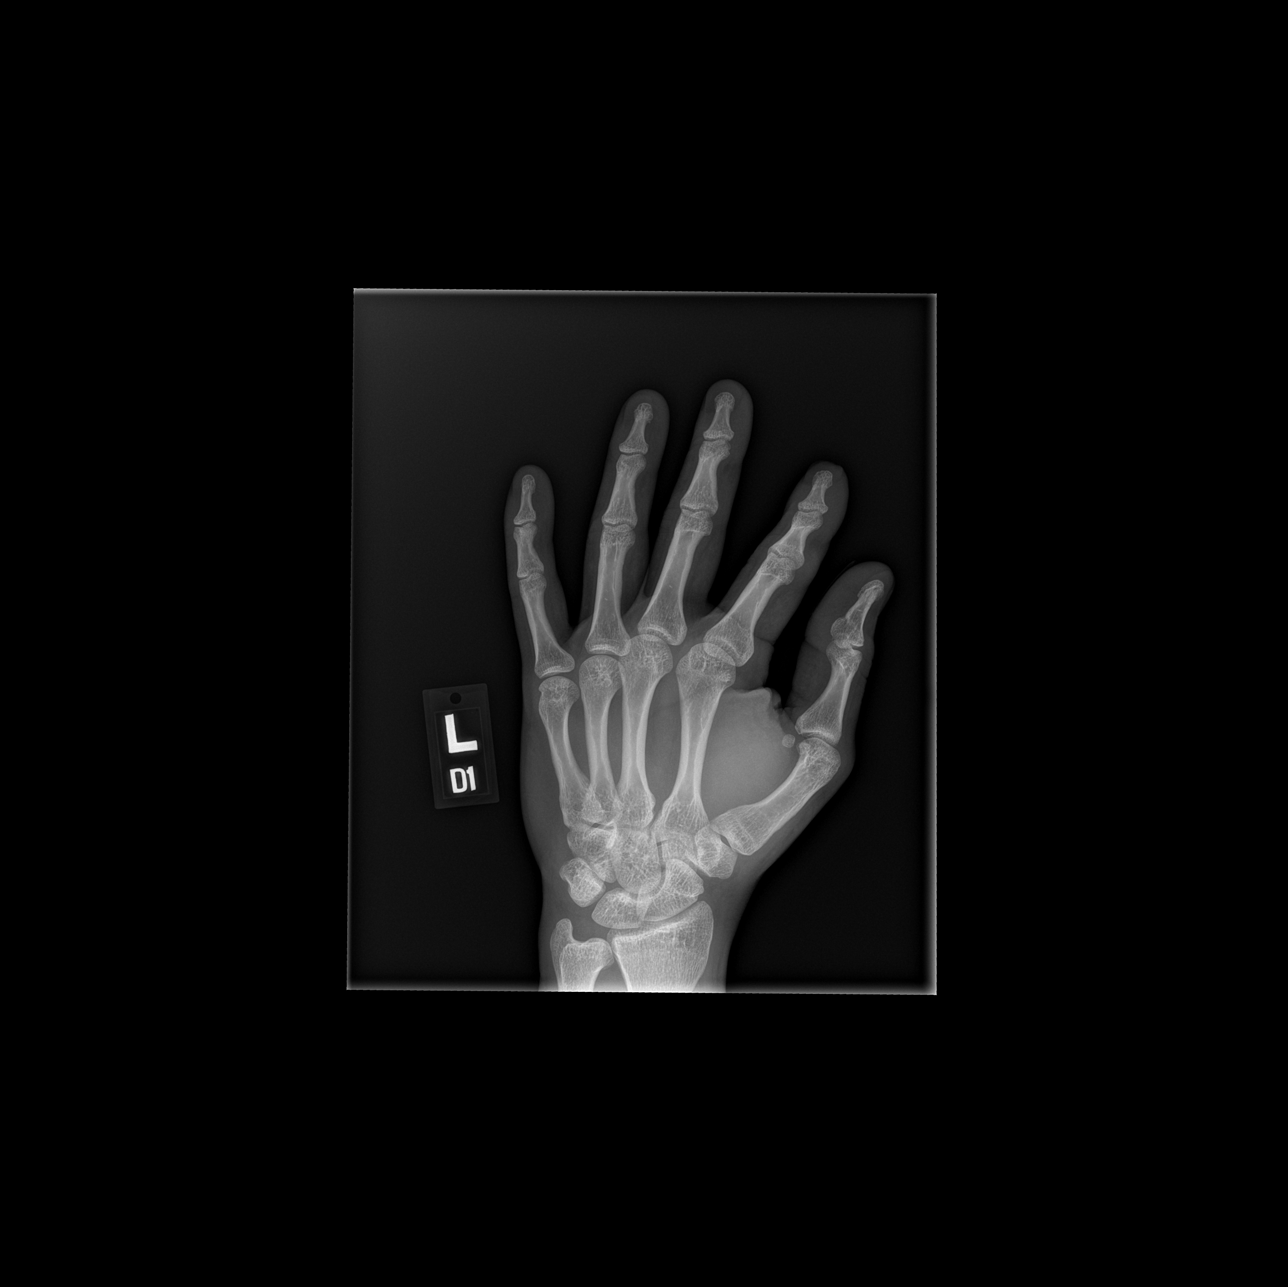

[x hand lat left]
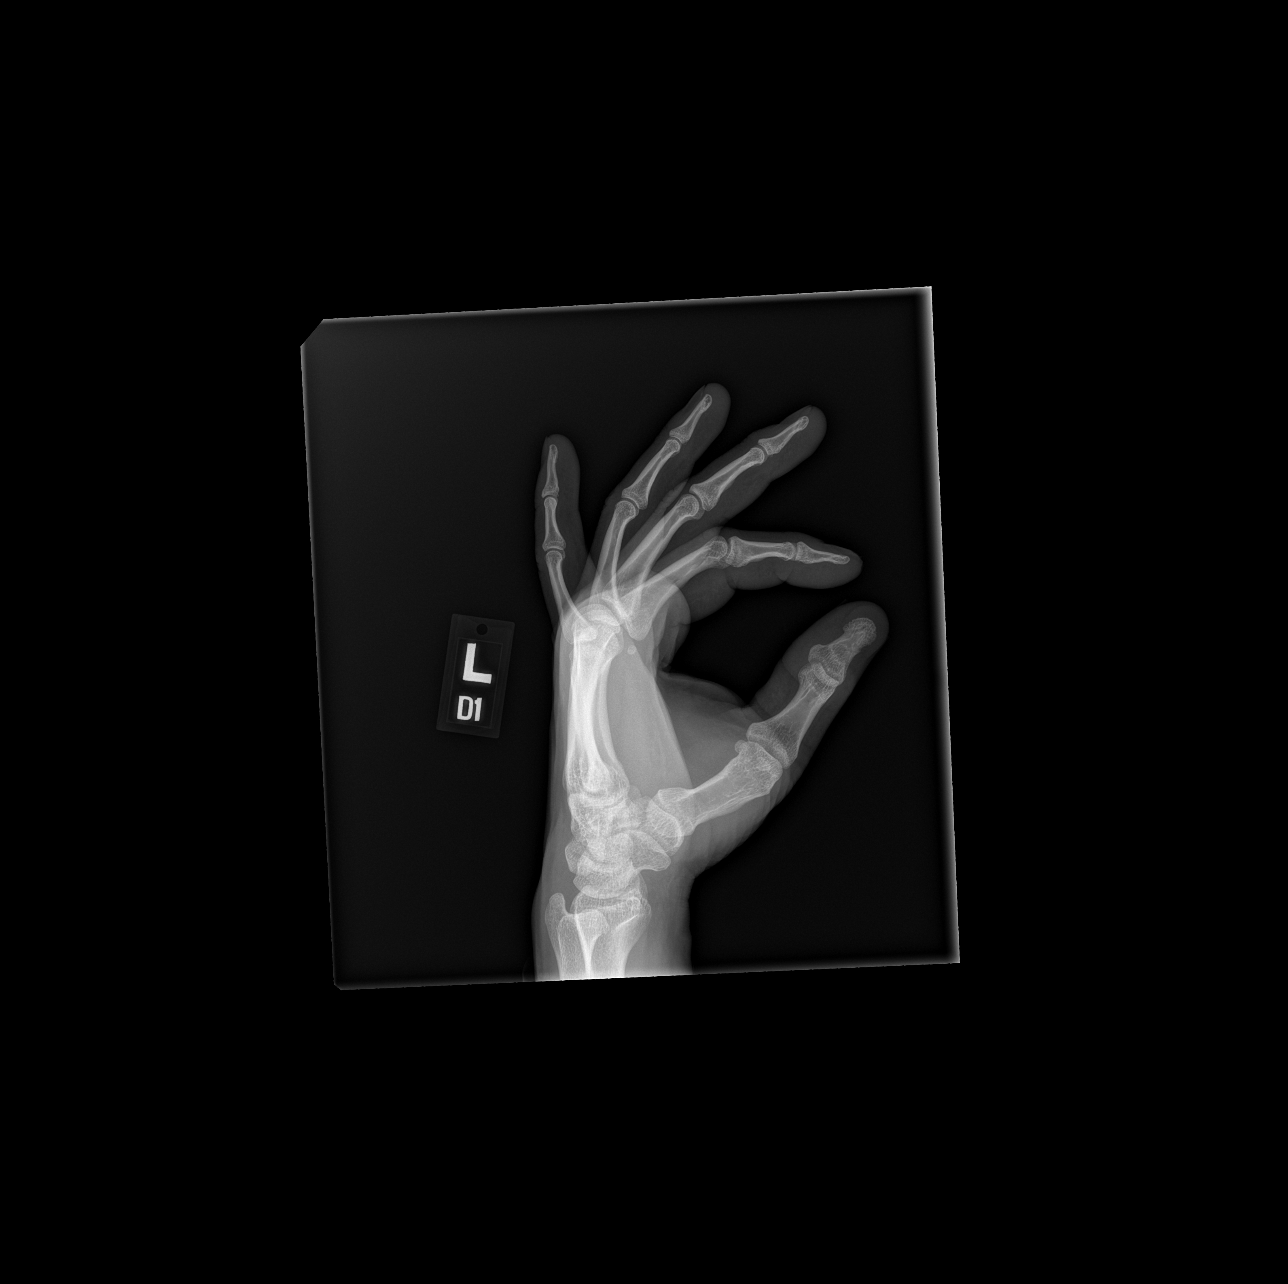

[3 of 3 positions shown; findings below may reference images not displayed]

FINDINGS: There is no evidence of fracture or dislocation. There is no
evidence of arthropathy or other focal bone abnormality. No
radiopaque foreign bodies.
IMPRESSION: No acute fracture or malalignment.

## 2021-12-19 ENCOUNTER — Other Ambulatory Visit: Payer: Self-pay

## 2021-12-19 ENCOUNTER — Emergency Department (HOSPITAL_COMMUNITY)
Admission: EM | Admit: 2021-12-19 | Discharge: 2021-12-20 | Disposition: A | Discharge status: Home / Self Care | Payer: Self-pay | Attending: Emergency Medicine | Admitting: Emergency Medicine

## 2021-12-19 ENCOUNTER — Emergency Department (HOSPITAL_COMMUNITY): Payer: Self-pay

## 2021-12-19 DIAGNOSIS — M5442 Lumbago with sciatica, left side: Secondary | ICD-10-CM | POA: Insufficient documentation

## 2021-12-19 DIAGNOSIS — M5441 Lumbago with sciatica, right side: Secondary | ICD-10-CM | POA: Insufficient documentation

## 2021-12-19 DIAGNOSIS — M546 Pain in thoracic spine: Secondary | ICD-10-CM | POA: Insufficient documentation

## 2021-12-19 LAB — URINALYSIS, ROUTINE W REFLEX MICROSCOPIC
Bilirubin Urine: NEGATIVE
Glucose, UA: NEGATIVE mg/dL
Hgb urine dipstick: NEGATIVE
Ketones, ur: NEGATIVE mg/dL
Leukocytes,Ua: NEGATIVE
Nitrite: NEGATIVE
Protein, ur: NEGATIVE mg/dL
Specific Gravity, Urine: <1.005 — ABNORMAL LOW (ref 1.005–1.030)
pH: 6 (ref 5.0–8.0)

## 2021-12-19 NOTE — ED Provider Triage Note (Signed)
Emergency Medicine Provider Triage Evaluation Note  Adam Frazier , a 36 y.o. male  was evaluated in triage.  Pt complains of severe back pain no inciting injury pain is over the sacrum worse with movement better with rest does not radiate no red flag symptoms..  Review of Systems  Positive: Back pain Negative: Fever  Physical Exam  BP 124/86 (BP Location: Right Arm)   Pulse 60   Temp 98.1 F (36.7 C) (Oral)   Resp 18   SpO2 99%  Gen:   Awake, no distress   Resp:  Normal effort  MSK:   Moves extremities without difficulty  Other:    Medical Decision Making  Medically screening exam initiated at 5:42 PM.  Appropriate orders placed.  Adam Frazier was informed that the remainder of the evaluation will be completed by another provider, this initial triage assessment does not replace that evaluation, and the importance of remaining in the ED until their evaluation is complete.     Arthor Captain, PA-C 12/19/21 1758

## 2021-12-19 NOTE — ED Triage Notes (Signed)
Pt presents with low back pain x 1 week.  Pt states today it is moving up to his sides bilaterally.  No n/v/d or shob  No fever/chills.

## 2021-12-20 MED ORDER — KETOROLAC TROMETHAMINE 15 MG/ML IJ SOLN
15.0000 mg | Freq: Once | INTRAMUSCULAR | Status: AC
  Administered 2021-12-20: 15 mg via INTRAMUSCULAR
  Filled 2021-12-20: qty 1

## 2021-12-20 MED ORDER — PREDNISONE 10 MG (21) PO TBPK: ORAL_TABLET | Freq: Every day | ORAL | 0 refills | Status: AC

## 2021-12-20 MED ORDER — NAPROXEN 500 MG PO TABS: 500.0000 mg | ORAL_TABLET | Freq: Two times a day (BID) | ORAL | 0 refills | Status: AC

## 2021-12-20 MED ORDER — DEXAMETHASONE SODIUM PHOSPHATE 10 MG/ML IJ SOLN
10.0000 mg | Freq: Once | INTRAMUSCULAR | Status: AC
  Administered 2021-12-20: 10 mg via INTRAMUSCULAR
  Filled 2021-12-20: qty 1

## 2021-12-20 NOTE — ED Provider Notes (Signed)
[facility] EMERGENCY DEPARTMENT Provider Note   CSN: [id number] Arrival date & time: 12/19/21  1628     History  Chief Complaint  Patient presents with   Back Pain    Adam Frazier is a 36 y.o. male.  Patient presents to the emergency department complaining of severe back pain in the sacral region.  Patient complains of pain which is worse with movement and better than rest.  He denies any radiation of pain.  He denies any red flag symptoms such as saddle anesthesia, urinary incontinence, fecal incontinence, urinary retention.  Patient denies any traumas or injuries to the area.  No relevant past medical history on file.  HPI     Home Medications Prior to Admission medications   Medication Sig Start Date End Date Taking? Authorizing Provider  naproxen (NAPROSYN) 500 MG tablet Take 1 tablet (500 mg total) by mouth 2 (two) times daily. 12/20/21  Yes Darrick Grinder, PA-C  predniSONE (STERAPRED UNI-PAK 21 TAB) 10 MG (21) TBPK tablet Take by mouth daily. Take 6 tabs by mouth daily  for 2 days, then 5 tabs for 2 days, then 4 tabs for 2 days, then 3 tabs for 2 days, 2 tabs for 2 days, then 1 tab by mouth daily for 2 days 12/20/21  Yes Darrick Grinder, PA-C  acetaminophen (TYLENOL) 500 MG tablet Take 500 mg by mouth every 6 (six) hours as needed for moderate pain or headache.    [provider]  diclofenac Sodium (VOLTAREN) 1 % GEL Apply 4 g topically 4 (four) times daily. 06/05/21   Gailen Shelter, PA  diphenhydrAMINE (BENADRYL) 25 MG tablet Take 1 tablet (25 mg total) by mouth every 6 (six) hours as needed. 06/05/21   Gailen Shelter, PA  famotidine (PEPCID) 20 MG tablet Take 1 tablet (20 mg total) by mouth 2 (two) times daily. 06/05/21   Gailen Shelter, PA  hydrOXYzine (ATARAX) 25 MG tablet Take 1 tablet (25 mg total) by mouth every 6 (six) hours. 06/13/21   Kommor, Madison, MD  loratadine (CLARITIN) 10 MG tablet Take 10 mg by mouth daily as needed  for allergies.    [provider]  methylPREDNISolone (MEDROL DOSEPAK) 4 MG TBPK tablet Take as prescribed 06/13/21   Kommor, Madison, MD  Multiple Minerals-Vitamins (CAL-MAG-ZINC-D PO) Take 3 tablets by mouth daily.    [provider]  OVER THE COUNTER MEDICATION Take 1 tablet by mouth every 8 (eight) hours as needed (leg and back pain). Leg and Back Pain Reliever    [provider]  permethrin (ELIMITE) 5 % cream Apply to affected area once 06/13/21   Kommor, Madison, MD  triamcinolone cream (KENALOG) 0.1 % Apply 1 application. topically 2 (two) times daily. Apply to the areas of rash 06/05/21   Gailen Shelter, PA      Allergies    Patient has no known allergies.    Review of Systems   Review of Systems  Respiratory:  Negative for shortness of breath.   Cardiovascular:  Negative for chest pain.  Gastrointestinal:  Negative for abdominal pain.  Genitourinary:  Negative for difficulty urinating.  Musculoskeletal:  Positive for back pain.    Physical Exam Updated Vital Signs BP 130/89 (BP Location: Right Arm)   Pulse 60   Temp 98.2 F (36.8 C)   Resp 16   SpO2 100%  Physical Exam Vitals and nursing note reviewed.  Constitutional:      General: He  is not in acute distress.    Appearance: He is normal weight.  HENT:     Head: Normocephalic and atraumatic.     Mouth/Throat:     Mouth: Mucous membranes are moist.  Eyes:     Conjunctiva/sclera: Conjunctivae normal.  Cardiovascular:     Rate and Rhythm: Normal rate and regular rhythm.     Pulses: Normal pulses.  Pulmonary:     Effort: Pulmonary effort is normal.     Breath sounds: Normal breath sounds.  Abdominal:     Palpations: Abdomen is soft.     Tenderness: There is no abdominal tenderness.  Musculoskeletal:        General: Normal range of motion.     Cervical back: Normal range of motion and neck supple.  Skin:    General: Skin is warm and dry.  Neurological:     General: No focal deficit  present.     Mental Status: He is alert.     Sensory: No sensory deficit.     Motor: No weakness.     Coordination: Coordination normal.     Gait: Gait normal.     ED Results / Procedures / Treatments   Labs (all labs ordered are listed, but only abnormal results are displayed) Labs Reviewed  URINALYSIS, ROUTINE W REFLEX MICROSCOPIC - Abnormal; Notable for the following components:      Result Value   Specific Gravity, Urine <1.005 (*)    All other components within normal limits    EKG None  Radiology DG Lumbar Spine Complete  Result Date: 12/19/2021 CLINICAL DATA:  pain EXAM: LUMBAR SPINE - COMPLETE 4+ VIEW COMPARISON:  None Available. FINDINGS: Limited evaluation due to overlapping osseous structures and overlying soft tissues. There is no evidence of lumbar spine fracture. Multilevel mild facet arthropathy. Alignment is normal. Intervertebral disc spaces are maintained. IMPRESSION: No acute displaced fracture or traumatic listhesis of the lumbar spine. Electronically Signed   By: Tish Frederickson M.D.   On: 12/19/2021 18:59   DG Sacrum/Coccyx  Result Date: 12/19/2021 CLINICAL DATA:  Low back pain for 1 week EXAM: SACRUM AND COCCYX - 2+ VIEW COMPARISON:  None Available. FINDINGS: There is no evidence of fracture or other focal bone lesions. IMPRESSION: Negative. Electronically Signed   By: Minerva Fester M.D.   On: 12/19/2021 18:53    Procedures Procedures    Medications Ordered in ED Medications  ketorolac (TORADOL) 15 MG/ML injection 15 mg (has no administration in time range)  dexamethasone (DECADRON) injection 10 mg (has no administration in time range)    ED Course/ Medical Decision Making/ A&P                           Medical Decision Making Risk Prescription drug management.   This patient presents to the ED for concern of back pain, this involves an extensive number of treatment options, and is a complaint that carries with it a high risk of  complications and morbidity.  The differential diagnosis includes lumbar pain with radiculopathy, fracture, dislocation, cauda equina, and others   Co morbidities that complicate the patient evaluation  None   Lab Tests:  I Ordered, and personally interpreted labs.  The pertinent results include: Urinalysis negative for acute findings   Imaging Studies ordered:  I ordered imaging studies including plain films of the lumbar spine and sacrum I independently visualized and interpreted imaging which showed no acute fracture or dislocation I agree  with the radiologist interpretation  Problem List / ED Course / Critical interventions / Medication management   I ordered medication including Toradol and Decadron for inflammation Reevaluation of the patient after these medicines showed that the patient improved I have reviewed the patients home medicines and have made adjustments as needed    Test / Admission - Considered:  Patient has no red flag symptoms such as urinary retention, saddle anesthesia and which would suggest cauda equina.  Patient does complain of some mild radiation of symptoms down bilateral legs suggestive of radiculopathy.  He states that his pain is worse with movement and better with rest.  At this time I see no indication for further emergent work-up.  Patient prescribed Naprosyn and steroid Dosepak.  Patient to follow-up with primary care for further evaluation and management.        Final Clinical Impression(s) / ED Diagnoses Final diagnoses:  Acute midline low back pain with bilateral sciatica    Rx / DC Orders ED Discharge Orders          Ordered    naproxen (NAPROSYN) 500 MG tablet  2 times daily        12/20/21 1003    predniSONE (STERAPRED UNI-PAK 21 TAB) 10 MG (21) TBPK tablet  Daily        12/20/21 1003              Pamala Duffel 12/20/21 1003    Gloris Manchester, MD 12/20/21 1503

## 2021-12-20 NOTE — Discharge Instructions (Signed)
You were evaluated today for back pain.  Your imaging shows no fractures or dislocation.  Your symptoms are consistent with irritation of the nerve roots called radiculopathy.  I prescribed a steroid and an anti-inflammatory.  Please take these as prescribed.  I do recommend follow-up with your primary care provider as you may need further conservative management such as physical therapy.  If you develop any life-threatening symptoms please return to the emergency department

## 2023-07-31 ENCOUNTER — Inpatient Hospital Stay (HOSPITAL_BASED_OUTPATIENT_CLINIC_OR_DEPARTMENT_OTHER)
Admit: 2023-07-31 | Discharge: 2023-07-31 | Disposition: A | Discharge status: Home / Self Care | Payer: Self-pay | Attending: Internal Medicine | Admitting: Internal Medicine

## 2023-07-31 ENCOUNTER — Other Ambulatory Visit: Payer: Self-pay | Admitting: Home Health

## 2023-07-31 ENCOUNTER — Emergency Department (HOSPITAL_COMMUNITY): Payer: Self-pay

## 2023-07-31 ENCOUNTER — Other Ambulatory Visit: Payer: Self-pay

## 2023-07-31 ENCOUNTER — Emergency Department (HOSPITAL_COMMUNITY)
Admission: EM | Admit: 2023-07-31 | Discharge: 2023-07-31 | Disposition: A | Discharge status: Home / Self Care | Payer: Self-pay | Attending: Emergency Medicine | Admitting: Emergency Medicine

## 2023-07-31 DIAGNOSIS — R55 Syncope and collapse: Secondary | ICD-10-CM

## 2023-07-31 DIAGNOSIS — Y92002 Bathroom of unspecified non-institutional (private) residence single-family (private) house as the place of occurrence of the external cause: Secondary | ICD-10-CM | POA: Insufficient documentation

## 2023-07-31 DIAGNOSIS — S0990XA Unspecified injury of head, initial encounter: Secondary | ICD-10-CM | POA: Insufficient documentation

## 2023-07-31 DIAGNOSIS — W182XXA Fall in (into) shower or empty bathtub, initial encounter: Secondary | ICD-10-CM | POA: Insufficient documentation

## 2023-07-31 DIAGNOSIS — M25511 Pain in right shoulder: Secondary | ICD-10-CM | POA: Insufficient documentation

## 2023-07-31 LAB — URINALYSIS, ROUTINE W REFLEX MICROSCOPIC
Bilirubin Urine: NEGATIVE
Glucose, UA: NEGATIVE mg/dL
Hgb urine dipstick: NEGATIVE
Ketones, ur: NEGATIVE mg/dL
Leukocytes,Ua: NEGATIVE
Nitrite: NEGATIVE
Protein, ur: NEGATIVE mg/dL
Specific Gravity, Urine: 1.009 (ref 1.005–1.030)
pH: 7 (ref 5.0–8.0)

## 2023-07-31 LAB — CBC
HCT: 43.4 % (ref 39.0–52.0)
Hemoglobin: 14.5 g/dL (ref 13.0–17.0)
MCH: 31.1 pg (ref 26.0–34.0)
MCHC: 33.4 g/dL (ref 30.0–36.0)
MCV: 93.1 fL (ref 80.0–100.0)
Platelets: 210 K/uL (ref 150–400)
RBC: 4.66 MIL/uL (ref 4.22–5.81)
RDW: 12.7 % (ref 11.5–15.5)
WBC: 6.6 K/uL (ref 4.0–10.5)
nRBC: 0 % (ref 0.0–0.2)

## 2023-07-31 LAB — COMPREHENSIVE METABOLIC PANEL WITH GFR
ALT: 27 U/L (ref 0–44)
AST: 29 U/L (ref 15–41)
Albumin: 3.9 g/dL (ref 3.5–5.0)
Alkaline Phosphatase: 70 U/L (ref 38–126)
Anion gap: 12 (ref 5–15)
BUN: 18 mg/dL (ref 6–20)
CO2: 23 mmol/L (ref 22–32)
Calcium: 9.6 mg/dL (ref 8.9–10.3)
Chloride: 105 mmol/L (ref 98–111)
Creatinine, Ser: 1.23 mg/dL (ref 0.61–1.24)
GFR, Estimated: >60 mL/min (ref >60)
Glucose, Bld: 105 mg/dL — ABNORMAL HIGH (ref 70–99)
Potassium: 4.4 mmol/L (ref 3.5–5.1)
Sodium: 140 mmol/L (ref 135–145)
Total Bilirubin: 0.9 mg/dL (ref 0.0–1.2)
Total Protein: 7 g/dL (ref 6.5–8.1)

## 2023-07-31 LAB — TROPONIN I (HIGH SENSITIVITY)
Troponin I (High Sensitivity): 18 ng/L — ABNORMAL HIGH (ref <18)
Troponin I (High Sensitivity): 18 ng/L — ABNORMAL HIGH (ref <18)

## 2023-07-31 MED ORDER — IBUPROFEN 600 MG PO TABS: 600.0000 mg | ORAL_TABLET | Freq: Four times a day (QID) | ORAL | 0 refills | Status: AC | PRN

## 2023-07-31 MED ORDER — OXYCODONE-ACETAMINOPHEN 5-325 MG PO TABS
2.0000 | ORAL_TABLET | Freq: Once | ORAL | Status: AC
  Administered 2023-07-31: 2 via ORAL
  Filled 2023-07-31: qty 2

## 2023-07-31 NOTE — ED Notes (Signed)
 PT ambulating to the bathroom with the assistance of his wife.

## 2023-07-31 NOTE — Progress Notes (Addendum)
 2 week live Zio for syncope requested by Dr Loni, will arrange new patient appt with [facility] heart care in 3-4 weeks.

## 2023-07-31 NOTE — ED Triage Notes (Signed)
 Pt/translator stated, This morning in 300am I went to bathroom and fell in the bathtub and I broke my jaw myself. I passed out a total of 3 times, 300am, 330am, 3 episodes. Now, I have pain in my jaw, shoulder, and my feet feel weak when I walk. This happened a long time ago and said it happen due to stress.

## 2023-07-31 NOTE — ED Notes (Signed)
 Specialist bedside to place cardiac monitor

## 2023-07-31 NOTE — Discharge Instructions (Addendum)
 Le examinaron y evaluaron por sncope. Todo est saliendo Adam Frazier, lo cual es una buena noticia. Le colocaron un parche Zio para monitorizar su corazn. Necesita una cita de seguimiento con cardiologa. Deberan contactarle para programar una cita. Tome 600 mg de ibuprofeno cada 6 horas segn sea necesario para el dolor de cabeza. Puede regresar a urgencias si los sntomas empeoran.

## 2023-07-31 NOTE — ED Provider Triage Note (Signed)
 Emergency Medicine Provider Triage Evaluation Note  Adam Frazier , a 38 y.o. male  was evaluated in triage.  Pt complains of pain to right jaw after passing out and striking mouth on bathtub last night.  He reports that he passed out 3 times within a few minutes of each other.  He has had this happen in the distant past..  Review of Systems  Positive: Pain in jaw after passing out Negative: Chest pain, fever, nausea, vomiting, dyspnea  Physical Exam  BP (!) 133/96 (BP Location: Right Arm)   Pulse 65   Temp 98.4 F (36.9 C)   Resp 16   SpO2 100%  Gen:   Awake, no distress   Resp:  Normal effort  MSK:   Moves extremities without difficulty  Other:  Some pain on left mandible and small laceration noted   Medical Decision Making  Medically screening exam initiated at 9:58 AM.  Appropriate orders placed.  Jamall Strohmeier was informed that the remainder of the evaluation will be completed by another provider, this initial triage assessment does not replace that evaluation, and the importance of remaining in the ED until their evaluation is complete.  Will obtain CT, syncope workup, labs   Levander Houston, MD 07/31/23 1009

## 2023-07-31 NOTE — ED Provider Notes (Signed)
 [place] [facility] Provider Note   CSN: [id number] Arrival date & time: 07/31/23  9143     Patient presents with: Loss of Consciousness, Jaw Pain, and Shoulder Pain   Adam Frazier is a 38 y.o. male patient who presents to the emergency department today for further evaluation of loss of consciousness that occurred this morning.  Patient states he got up to go to the bathroom when he lost consciousness and hit the tub on the way down with his face and landed on his right shoulder.  He came to and try to get up again and passed out again.  He denied any chest pain or shortness of breath. Denies any prodromal symptoms.  Currently denies any focal weakness or numbness.  Complaining of jaw pain and right shoulder pain.  Denies any fever, cough, congestion, nausea, vomiting, diarrhea.   A language interpreter was used [phone]).  Loss of Consciousness Shoulder Pain      Prior to Admission medications   Medication Sig Start Date End Date Taking? Authorizing Provider  acetaminophen  (TYLENOL ) 500 MG tablet Take 500 mg by mouth every 6 (six) hours as needed for moderate pain or headache.    [provider]  diclofenac  Sodium (VOLTAREN ) 1 % GEL Apply 4 g topically 4 (four) times daily. 06/05/21   Neldon Hamp RAMAN, PA  diphenhydrAMINE  (BENADRYL ) 25 MG tablet Take 1 tablet (25 mg total) by mouth every 6 (six) hours as needed. 06/05/21   Neldon Hamp RAMAN, PA  famotidine  (PEPCID ) 20 MG tablet Take 1 tablet (20 mg total) by mouth 2 (two) times daily. 06/05/21   Neldon Hamp RAMAN, PA  hydrOXYzine  (ATARAX ) 25 MG tablet Take 1 tablet (25 mg total) by mouth every 6 (six) hours. 06/13/21   Kommor, Madison, MD  loratadine (CLARITIN) 10 MG tablet Take 10 mg by mouth daily as needed for allergies.    [provider]  methylPREDNISolone  (MEDROL  DOSEPAK) 4 MG TBPK tablet Take as prescribed 06/13/21   Kommor, Madison, MD  Multiple Minerals-Vitamins  (CAL-MAG-ZINC-D PO) Take 3 tablets by mouth daily.    [provider]  naproxen  (NAPROSYN ) 500 MG tablet Take 1 tablet (500 mg total) by mouth 2 (two) times daily. 12/20/21   Logan Ubaldo NOVAK, PA-C  OVER THE COUNTER MEDICATION Take 1 tablet by mouth every 8 (eight) hours as needed (leg and back pain). Leg and Back Pain Reliever    [provider]  permethrin  (ELIMITE ) 5 % cream Apply to affected area once 06/13/21   Kommor, Madison, MD  predniSONE  (STERAPRED UNI-PAK 21 TAB) 10 MG (21) TBPK tablet Take by mouth daily. Take 6 tabs by mouth daily  for 2 days, then 5 tabs for 2 days, then 4 tabs for 2 days, then 3 tabs for 2 days, 2 tabs for 2 days, then 1 tab by mouth daily for 2 days 12/20/21   Logan Ubaldo B, PA-C  triamcinolone  cream (KENALOG ) 0.1 % Apply 1 application. topically 2 (two) times daily. Apply to the areas of rash 06/05/21   Neldon Hamp RAMAN, PA    Allergies: Patient has no known allergies.    Review of Systems  Cardiovascular:  Positive for syncope.  All other systems reviewed and are negative.   Updated Vital Signs BP 130/84   Pulse 72   Temp 98.4 F (36.9 C)   Resp 16   SpO2 100%   Physical Exam Vitals and nursing note reviewed.  Constitutional:  General: He is not in acute distress.    Appearance: Normal appearance.  HENT:     Head: Normocephalic and atraumatic.  Eyes:     General:        Right eye: No discharge.        Left eye: No discharge.  Cardiovascular:     Comments: Regular rate and rhythm.  S1/S2 are distinct without any evidence of murmur, rubs, or gallops.  Radial pulses are 2+ bilaterally.  Dorsalis pedis pulses are 2+ bilaterally.  No evidence of pedal edema. Pulmonary:     Comments: Clear to auscultation bilaterally.  Normal effort.  No respiratory distress.  No evidence of wheezes, rales, or rhonchi heard throughout. Abdominal:     General: Abdomen is flat. Bowel sounds are normal. There is no distension.     Tenderness:  There is no abdominal tenderness. There is no guarding or rebound.  Musculoskeletal:        General: Normal range of motion.     Cervical back: Neck supple.     Comments: Pelvis is stable to palpation.  Nontender to palpation  Skin:    General: Skin is warm and dry.     Findings: No rash.  Neurological:     General: No focal deficit present.     Mental Status: He is alert.  Psychiatric:        Mood and Affect: Mood normal.        Behavior: Behavior normal.     (all labs ordered are listed, but only abnormal results are displayed) Labs Reviewed  COMPREHENSIVE METABOLIC PANEL WITH GFR - Abnormal; Notable for the following components:      Result Value   Glucose, Bld 105 (*)    All other components within normal limits  URINALYSIS, ROUTINE W REFLEX MICROSCOPIC - Abnormal; Notable for the following components:   Color, Urine STRAW (*)    All other components within normal limits  TROPONIN I (HIGH SENSITIVITY) - Abnormal; Notable for the following components:   Troponin I (High Sensitivity) 18 (*)    All other components within normal limits  TROPONIN I (HIGH SENSITIVITY) - Abnormal; Notable for the following components:   Troponin I (High Sensitivity) 18 (*)    All other components within normal limits  CBC  CBG MONITORING, ED    EKG: EKG Interpretation Date/Time:  Tuesday July 31 2023 09:39:53 EDT Ventricular Rate:  69 PR Interval:  146 QRS Duration:  80 QT Interval:  390 QTC Calculation: 417 R Axis:   55  Text Interpretation: Normal sinus rhythm Normal ECG No previous ECGs available Confirmed by Levander Houston [id number]) on 07/31/2023 10:09:35 AM  Radiology: [facility] Shoulder Right Result Date: 07/31/2023 CLINICAL DATA:  Right shoulder pain status post fall EXAM: RIGHT SHOULDER - 3 VIEW COMPARISON:  None Available. FINDINGS: There is no evidence of fracture or dislocation. There is no evidence of arthropathy or other focal bone abnormality. Soft tissues are unremarkable.  IMPRESSION: No acute fracture or dislocation. Electronically Signed   By: Limin  Xu M.D.   On: 07/31/2023 13:29   CT Head Wo Contrast Result Date: 07/31/2023 CLINICAL DATA:  Provided history: Head trauma, moderate/severe. Additional history provided: Syncope, jaw pain. EXAM: CT HEAD WITHOUT CONTRAST CT MAXILLOFACIAL WITHOUT CONTRAST CT CERVICAL SPINE WITHOUT CONTRAST TECHNIQUE: Multidetector CT imaging of the head, cervical spine, and maxillofacial structures were performed using the standard protocol without intravenous contrast. Multiplanar CT image reconstructions of the cervical spine and maxillofacial structures were also generated.  RADIATION DOSE REDUCTION: This exam was performed according to the departmental dose-optimization program which includes automated exposure control, adjustment of the mA and/or kV according to patient size and/or use of iterative reconstruction technique. COMPARISON:  None. FINDINGS: CT HEAD FINDINGS Brain: Cerebral volume is normal. 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 There is no acute intracranial hemorrhage. No acute demarcated cortical infarct. No extra-axial fluid collection. No evidence of an intracranial mass. No midline shift. Vascular: No hyperdense vessel.  Atherosclerotic calcifications. Skull: Age-indeterminate nondepressed left frontal calvarial fracture (for instance as seen on series 5, image 67). CT MAXILLOFACIAL FINDINGS Osseous: No evidence of an acute maxillofacial or mandibular fracture. Orbits: No acute orbital finding. Sinuses: No significant paranasal sinus disease. Soft tissues: No definite maxillofacial hematoma appreciable by CT. CT CERVICAL SPINE FINDINGS Alignment: Nonspecific reversal of the expected cervical lordosis. No significant spondylolisthesis. Skull base and vertebrae: The basion-dental and atlanto-dental  intervals are maintained.No evidence of acute fracture to the cervical spine. Soft tissues and spinal canal: No prevertebral fluid or swelling. No visible canal hematoma. Disc levels: No significant spinal canal stenosis is appreciated. No significant bony neural foraminal narrowing. Upper chest: No consolidation within the imaged lung apices. No visible pneumothorax. IMPRESSION: CT head: 1. Age-indeterminate non-depressed left frontal calvarial fracture. 2. No evidence of an acute intracranial abnormality. 3. 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 CT maxillofacial: No evidence of acute maxillofacial fracture. CT cervical spine: 1. No evidence of an acute cervical spine fracture. 2. Nonspecific reversal of the expected cervical lordosis. Electronically Signed   By: Rockey Childs D.O.   On: 07/31/2023 12:19   CT Cervical Spine Wo Contrast Result Date: 07/31/2023 CLINICAL DATA:  Provided history: Head trauma, moderate/severe. Additional history provided: Syncope, jaw pain. EXAM: CT HEAD WITHOUT CONTRAST CT MAXILLOFACIAL WITHOUT CONTRAST CT CERVICAL SPINE WITHOUT CONTRAST TECHNIQUE: Multidetector CT imaging of the head, cervical spine, and maxillofacial structures were performed using the standard protocol without intravenous contrast. Multiplanar CT image reconstructions of the cervical spine and maxillofacial structures were also generated. RADIATION DOSE REDUCTION: This exam was performed according to the departmental dose-optimization program which includes automated exposure control, adjustment of the mA and/or kV according to patient size and/or use of iterative reconstruction technique. COMPARISON:  None. FINDINGS: CT HEAD FINDINGS Brain: Cerebral volume is normal. Foci of chronic encephalomalacia/gliosis within the anteroinferior frontal lobes bilaterally, and within the  anterior right temporal lobe, likely posttraumatic in etiology. There is no acute intracranial hemorrhage. No acute demarcated cortical infarct. No extra-axial fluid collection. No evidence of an intracranial mass. No midline shift. Vascular: No hyperdense vessel.  Atherosclerotic calcifications. Skull: Age-indeterminate nondepressed left frontal calvarial fracture (for instance as seen on series 5, image 67). CT MAXILLOFACIAL FINDINGS Osseous: No evidence of an acute maxillofacial or mandibular fracture. Orbits: No acute orbital finding. Sinuses: No significant paranasal sinus disease. Soft tissues: No definite maxillofacial hematoma appreciable by CT. CT CERVICAL SPINE FINDINGS Alignment: Nonspecific reversal of the expected cervical lordosis. No significant spondylolisthesis. Skull base and vertebrae: The basion-dental and atlanto-dental intervals are maintained.No evidence of acute fracture to the cervical spine. Soft tissues and spinal canal: No prevertebral fluid or swelling. No visible canal hematoma. Disc levels: No significant spinal canal stenosis is appreciated. No significant bony neural foraminal narrowing. Upper chest: No consolidation within the imaged lung apices. No visible  pneumothorax. IMPRESSION: CT head: 1. Age-indeterminate non-depressed left frontal calvarial fracture. 2. No evidence of an acute intracranial abnormality. 3. 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 CT maxillofacial: No evidence of acute maxillofacial fracture. CT cervical spine: 1. No evidence of an acute cervical spine fracture. 2. Nonspecific reversal of the expected cervical lordosis. Electronically Signed   By: Rockey Childs D.O.   On: 07/31/2023 12:19   CT Maxillofacial Wo Contrast Result Date: 07/31/2023 CLINICAL DATA:  Provided history: Head trauma, moderate/severe. Additional history  provided: Syncope, jaw pain. EXAM: CT HEAD WITHOUT CONTRAST CT MAXILLOFACIAL WITHOUT CONTRAST CT CERVICAL SPINE WITHOUT CONTRAST TECHNIQUE: Multidetector CT imaging of the head, cervical spine, and maxillofacial structures were performed using the standard protocol without intravenous contrast. Multiplanar CT image reconstructions of the cervical spine and maxillofacial structures were also generated. RADIATION DOSE REDUCTION: This exam was performed according to the departmental dose-optimization program which includes automated exposure control, adjustment of the mA and/or kV according to patient size and/or use of iterative reconstruction technique. COMPARISON:  None. FINDINGS: CT HEAD FINDINGS Brain: Cerebral volume is normal. 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 There is no acute intracranial hemorrhage. No acute demarcated cortical infarct. No extra-axial fluid collection. No evidence of an intracranial mass. No midline shift. Vascular: No hyperdense vessel.  Atherosclerotic calcifications. Skull: Age-indeterminate nondepressed left frontal calvarial fracture (for instance as seen on series 5, image 67). CT MAXILLOFACIAL FINDINGS Osseous: No evidence of an acute maxillofacial or mandibular fracture. Orbits: No acute orbital finding. Sinuses: No significant paranasal sinus disease. Soft tissues: No definite maxillofacial hematoma appreciable by CT. CT CERVICAL SPINE FINDINGS Alignment: Nonspecific reversal of the expected cervical lordosis. No significant spondylolisthesis. Skull base and vertebrae: The basion-dental and atlanto-dental intervals are maintained.No evidence of acute fracture to the cervical spine. Soft tissues and spinal canal: No prevertebral fluid or swelling. No visible canal hematoma. Disc levels: No significant spinal canal stenosis is appreciated. No  significant bony neural foraminal narrowing. Upper chest: No consolidation within the imaged lung apices. No visible pneumothorax. IMPRESSION: CT head: 1. Age-indeterminate non-depressed left frontal calvarial fracture. 2. No evidence of an acute intracranial abnormality. 3. 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 CT maxillofacial: No evidence of acute maxillofacial fracture. CT cervical spine: 1. No evidence of an acute cervical spine fracture. 2. Nonspecific reversal of the expected cervical lordosis. Electronically Signed   By: Rockey Childs D.O.   On: 07/31/2023 12:19     Procedures   Medications Ordered in the ED  oxyCODONE -acetaminophen  (PERCOCET/ROXICET) 5-325 MG per tablet 2 tablet (2 tablets Oral Given 07/31/23 1453)    Clinical Course as of 07/31/23 1549  Tue Jul 31, 2023  1534 I spoke with Dr. Matt with cardiology who will see if we can get a Holter monitor placed prior to discharge and they will see him in the office. [CF]  1535 Urinalysis, Routine w reflex microscopic -Urine, Clean Catch(!) Negative. [CF]  1535 Troponin I (High Sensitivity)(!) Initial and delta troponin are 18 but flat. [CF]  1535 CBC Negative. [CF]  1535 Comprehensive metabolic panel(!) Normal. [CF]    Clinical Course User Index [CF] Theotis Cameron HERO, PA-C    Medical Decision Making Adam Frazier is a 38 y.o. male patient  presents to the emergency department today for further evaluation of loss of consciousness.  Could be cardiac in origin although patient did not have any chest pain or shortness of breath prior to this.  Low suspicion for stroke at this time.  Patient can move all 4 extremities spontaneously.  Will plan to get a CT scan of the head, neck, and face to further assess.  Will also get imaging over the right shoulder.  Will add on some cardiac enzymes as  well, EKG, basic labs.  Workup is ultimately unrevealing.  Troponins were flat.  CT imaging did not reveal any significant signs of intracranial hemorrhage. Patient remains asymptomatic from a chest pain perspective.  Zio patch was placed here in the emergency department he will follow-up with cardiology.  We were looking for any arrhythmia.  Will treat headache conservatively with NSAIDs.  Strict return precautions were discussed.  He is safer discharge at this time.  Amount and/or Complexity of Data Reviewed Labs: ordered. Decision-making details documented in ED Course. Radiology: ordered.  Risk Prescription drug management.    Final diagnoses:  Syncope, unspecified syncope type  Injury of head, initial encounter    ED Discharge Orders     None          Theotis Cameron HERO, [place] 07/31/23 1549    Lenor Hollering, MD 08/01/23 [phone]

## 2023-08-24 ENCOUNTER — Ambulatory Visit: Payer: Self-pay | Attending: Nurse Practitioner | Admitting: Nurse Practitioner

## 2023-08-24 NOTE — Progress Notes (Deleted)
 Office Visit    Patient Name: Adam Frazier Date of Encounter: 08/24/2023  Primary Care Provider:  Default, Provider, MD Primary Cardiologist:  Soyla DELENA Merck, MD  Chief Complaint    ***  Past Medical History    No past medical history on file. No past surgical history on file.  Allergies  No Known Allergies   Labs/Other Studies Reviewed    The following studies were reviewed today: ***    Recent Labs: 07/31/2023: ALT 27; BUN 18; Creatinine, Ser 1.23; Hemoglobin 14.5; Platelets 210; Potassium 4.4; Sodium 140  Recent Lipid Panel No results found for: CHOL, TRIG, HDL, CHOLHDL, VLDL, LDLCALC, LDLDIRECT  History of Present Illness    *** 38 year old male with a history of syncope who presents for [facility] new patient evaluation in the setting of syncope.  He presented to the ED on 07/31/2023 in the setting of syncope with loss of consciousness.  He denies any prodromal symptoms.  Troponin was mildly elevated with a flat trend.  CT of the head was negative for acute process.  EKG was normal.  He was discharged with a 14-day ZIO monitor and advised follow-up with cardiology as an outpatient.  He presents today for follow-up.  Since his ED visit  CC: -Initial onset:  -Frequency/Duration:  -Associated symptoms:  -Aggravating/alleviating factors:  -Syncope/near syncope: -Prior cardiac history: -Prior ECG:  -Prior workup: -Prior treatment:  -Possible medication interactions:  -Caffeine:  -Alcohol:  -Tobacco: -OTC supplements:  -Comorbidities:  -Exercise level:  -Labs:  -Cardiac ROS: no chest pain, no shortness of breath, no PND, no orthopnea, no LE edema. She denies any headaches. -Family history:     Home Medications    Current Outpatient Medications  Medication Sig Dispense Refill   acetaminophen  (TYLENOL ) 500 MG tablet Take 500 mg by mouth every 6 (six) hours as needed for moderate pain or headache.     diclofenac  Sodium  (VOLTAREN ) 1 % GEL Apply 4 g topically 4 (four) times daily. 100 g 0   diphenhydrAMINE  (BENADRYL ) 25 MG tablet Take 1 tablet (25 mg total) by mouth every 6 (six) hours as needed. 30 tablet 0   famotidine  (PEPCID ) 20 MG tablet Take 1 tablet (20 mg total) by mouth 2 (two) times daily. 30 tablet 0   hydrOXYzine  (ATARAX ) 25 MG tablet Take 1 tablet (25 mg total) by mouth every 6 (six) hours. 12 tablet 0   ibuprofen  (ADVIL ) 600 MG tablet Take 1 tablet (600 mg total) by mouth every 6 (six) hours as needed. 30 tablet 0   loratadine (CLARITIN) 10 MG tablet Take 10 mg by mouth daily as needed for allergies.     methylPREDNISolone  (MEDROL  DOSEPAK) 4 MG TBPK tablet Take as prescribed 1 each 0   Multiple Minerals-Vitamins (CAL-MAG-ZINC-D PO) Take 3 tablets by mouth daily.     naproxen  (NAPROSYN ) 500 MG tablet Take 1 tablet (500 mg total) by mouth 2 (two) times daily. 30 tablet 0   OVER THE COUNTER MEDICATION Take 1 tablet by mouth every 8 (eight) hours as needed (leg and back pain). Leg and Back Pain Reliever     permethrin  (ELIMITE ) 5 % cream Apply to affected area once 60 g 0   predniSONE  (STERAPRED UNI-PAK 21 TAB) 10 MG (21) TBPK tablet Take by mouth daily. Take 6 tabs by mouth daily  for 2 days, then 5 tabs for 2 days, then 4 tabs for 2 days, then 3 tabs for 2 days, 2 tabs for 2 days,  then 1 tab by mouth daily for 2 days 42 tablet 0   triamcinolone  cream (KENALOG ) 0.1 % Apply 1 application. topically 2 (two) times daily. Apply to the areas of rash 30 g 0   No current facility-administered medications for this visit.     Review of Systems    ***.  All other systems reviewed and are otherwise negative except as noted above.    Physical Exam    VS:  There were no vitals taken for this visit. , BMI There is no height or weight on file to calculate BMI.     GEN: Well nourished, well developed, in no acute distress. HEENT: normal. Neck: Supple, no JVD, carotid bruits, or masses. Cardiac: RRR, no  murmurs, rubs, or gallops. No clubbing, cyanosis, edema.  Radials/DP/PT 2+ and equal bilaterally.  Respiratory:  Respirations regular and unlabored, clear to auscultation bilaterally. GI: Soft, nontender, nondistended, BS + x 4. MS: no deformity or atrophy. Skin: warm and dry, no rash. Neuro:  Strength and sensation are intact. Psych: Normal affect.  Accessory Clinical Findings    ECG personally reviewed by me today -    - no acute changes.   Lab Results  Component Value Date   WBC 6.6 07/31/2023   HGB 14.5 07/31/2023   HCT 43.4 07/31/2023   MCV 93.1 07/31/2023   PLT 210 07/31/2023   Lab Results  Component Value Date   CREATININE 1.23 07/31/2023   BUN 18 07/31/2023   NA 140 07/31/2023   K 4.4 07/31/2023   CL 105 07/31/2023   CO2 23 07/31/2023   Lab Results  Component Value Date   ALT 27 07/31/2023   AST 29 07/31/2023   ALKPHOS 70 07/31/2023   BILITOT 0.9 07/31/2023   No results found for: CHOL, HDL, LDLCALC, LDLDIRECT, TRIG, CHOLHDL  No results found for: HGBA1C  Assessment & Plan    1.  ***  No BP recorded.  {Refresh Note OR Click here to enter BP  :1}***   Damien JAYSON Braver, NP 08/24/2023, 6:53 AM

## 2023-08-27 ENCOUNTER — Ambulatory Visit: Payer: Self-pay | Admitting: Internal Medicine

## 2023-08-27 DIAGNOSIS — R55 Syncope and collapse: Secondary | ICD-10-CM

## 2023-08-27 NOTE — Addendum Note (Signed)
 Encounter addended by: Malvina Pina A on: 08/27/2023 8:57 AM  Actions taken: Imaging Exam ended
